# Patient Record
Sex: Male | Born: 2000 | Race: Black or African American | Hispanic: No | Marital: Single
Health system: Southern US, Community
[De-identification: ages and names within clinical notes are randomized; demographics above are authoritative.]

---
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
## 2019-02-11 ENCOUNTER — Encounter (HOSPITAL_COMMUNITY): Payer: Self-pay | Admitting: Emergency Medicine

## 2019-02-11 ENCOUNTER — Emergency Department (HOSPITAL_COMMUNITY): Payer: BC Managed Care – PPO

## 2019-02-11 ENCOUNTER — Other Ambulatory Visit: Payer: Self-pay

## 2019-02-11 ENCOUNTER — Emergency Department (HOSPITAL_COMMUNITY)
Admission: EM | Admit: 2019-02-11 | Discharge: 2019-02-11 | Payer: BC Managed Care – PPO | Attending: Emergency Medicine | Admitting: Emergency Medicine

## 2019-02-11 DIAGNOSIS — S52502A Unspecified fracture of the lower end of left radius, initial encounter for closed fracture: Secondary | ICD-10-CM

## 2019-02-11 DIAGNOSIS — Y9389 Activity, other specified: Secondary | ICD-10-CM | POA: Insufficient documentation

## 2019-02-11 DIAGNOSIS — Z23 Encounter for immunization: Secondary | ICD-10-CM | POA: Insufficient documentation

## 2019-02-11 DIAGNOSIS — Y9289 Other specified places as the place of occurrence of the external cause: Secondary | ICD-10-CM | POA: Insufficient documentation

## 2019-02-11 DIAGNOSIS — S022XXA Fracture of nasal bones, initial encounter for closed fracture: Secondary | ICD-10-CM | POA: Insufficient documentation

## 2019-02-11 DIAGNOSIS — Y999 Unspecified external cause status: Secondary | ICD-10-CM | POA: Diagnosis not present

## 2019-02-11 DIAGNOSIS — S52515A Nondisplaced fracture of left radial styloid process, initial encounter for closed fracture: Secondary | ICD-10-CM | POA: Insufficient documentation

## 2019-02-11 DIAGNOSIS — S82034B Nondisplaced transverse fracture of right patella, initial encounter for open fracture type I or II: Secondary | ICD-10-CM | POA: Insufficient documentation

## 2019-02-11 DIAGNOSIS — S032XXA Dislocation of tooth, initial encounter: Secondary | ICD-10-CM

## 2019-02-11 DIAGNOSIS — S62025A Nondisplaced fracture of middle third of navicular [scaphoid] bone of left wrist, initial encounter for closed fracture: Secondary | ICD-10-CM | POA: Insufficient documentation

## 2019-02-11 DIAGNOSIS — S52135A Nondisplaced fracture of neck of left radius, initial encounter for closed fracture: Secondary | ICD-10-CM | POA: Diagnosis not present

## 2019-02-11 DIAGNOSIS — W130XXA Fall from, out of or through balcony, initial encounter: Secondary | ICD-10-CM | POA: Insufficient documentation

## 2019-02-11 DIAGNOSIS — T1490XA Injury, unspecified, initial encounter: Secondary | ICD-10-CM

## 2019-02-11 DIAGNOSIS — S0990XA Unspecified injury of head, initial encounter: Secondary | ICD-10-CM | POA: Diagnosis present

## 2019-02-11 DIAGNOSIS — S81011A Laceration without foreign body, right knee, initial encounter: Secondary | ICD-10-CM | POA: Diagnosis not present

## 2019-02-11 DIAGNOSIS — S82034A Nondisplaced transverse fracture of right patella, initial encounter for closed fracture: Secondary | ICD-10-CM

## 2019-02-11 DIAGNOSIS — S0181XA Laceration without foreign body of other part of head, initial encounter: Secondary | ICD-10-CM | POA: Insufficient documentation

## 2019-02-11 DIAGNOSIS — S01521A Laceration with foreign body of lip, initial encounter: Secondary | ICD-10-CM | POA: Insufficient documentation

## 2019-02-11 DIAGNOSIS — T07XXXA Unspecified multiple injuries, initial encounter: Secondary | ICD-10-CM

## 2019-02-11 DIAGNOSIS — S52125A Nondisplaced fracture of head of left radius, initial encounter for closed fracture: Secondary | ICD-10-CM

## 2019-02-11 DIAGNOSIS — S62002A Unspecified fracture of navicular [scaphoid] bone of left wrist, initial encounter for closed fracture: Secondary | ICD-10-CM

## 2019-02-11 LAB — I-STAT CHEM 8, ED
BUN: 12 mg/dL (ref 6–20)
Calcium, Ion: 1.06 mmol/L — ABNORMAL LOW (ref 1.15–1.40)
Chloride: 105 mmol/L (ref 98–111)
Creatinine, Ser: 1.1 mg/dL (ref 0.61–1.24)
Glucose, Bld: 218 mg/dL — ABNORMAL HIGH (ref 70–99)
HCT: 49 % (ref 39.0–52.0)
Hemoglobin: 16.7 g/dL (ref 13.0–17.0)
Potassium: 3.4 mmol/L — ABNORMAL LOW (ref 3.5–5.1)
Sodium: 139 mmol/L (ref 135–145)
TCO2: 19 mmol/L — ABNORMAL LOW (ref 22–32)

## 2019-02-11 LAB — CBC WITH DIFFERENTIAL/PLATELET
Abs Immature Granulocytes: 0.09 10*3/uL — ABNORMAL HIGH (ref 0.00–0.07)
Basophils Absolute: 0 10*3/uL (ref 0.0–0.1)
Basophils Relative: 0 %
Eosinophils Absolute: 0 10*3/uL (ref 0.0–0.5)
Eosinophils Relative: 0 %
HCT: 49.1 % (ref 39.0–52.0)
Hemoglobin: 16 g/dL (ref 13.0–17.0)
Immature Granulocytes: 1 %
Lymphocytes Relative: 5 %
Lymphs Abs: 0.6 10*3/uL — ABNORMAL LOW (ref 0.7–4.0)
MCH: 26.8 pg (ref 26.0–34.0)
MCHC: 32.6 g/dL (ref 30.0–36.0)
MCV: 82.2 fL (ref 80.0–100.0)
Monocytes Absolute: 0.5 10*3/uL (ref 0.1–1.0)
Monocytes Relative: 4 %
Neutro Abs: 12.3 10*3/uL — ABNORMAL HIGH (ref 1.7–7.7)
Neutrophils Relative %: 90 %
Platelets: 322 10*3/uL (ref 150–400)
RBC: 5.97 MIL/uL — ABNORMAL HIGH (ref 4.22–5.81)
RDW: 13 % (ref 11.5–15.5)
WBC: 13.5 10*3/uL — ABNORMAL HIGH (ref 4.0–10.5)
nRBC: 0 % (ref 0.0–0.2)

## 2019-02-11 LAB — URINALYSIS, ROUTINE W REFLEX MICROSCOPIC
Bilirubin Urine: NEGATIVE
Glucose, UA: NEGATIVE mg/dL
Ketones, ur: 5 mg/dL — AB
Leukocytes,Ua: NEGATIVE
Nitrite: NEGATIVE
Protein, ur: >=300 mg/dL — AB
Specific Gravity, Urine: 1.042 — ABNORMAL HIGH (ref 1.005–1.030)
pH: 6 (ref 5.0–8.0)

## 2019-02-11 LAB — COMPREHENSIVE METABOLIC PANEL
ALT: 34 U/L (ref 0–44)
AST: 62 U/L — ABNORMAL HIGH (ref 15–41)
Albumin: 4.6 g/dL (ref 3.5–5.0)
Alkaline Phosphatase: 87 U/L (ref 38–126)
Anion gap: 16 — ABNORMAL HIGH (ref 5–15)
BUN: 11 mg/dL (ref 6–20)
CO2: 17 mmol/L — ABNORMAL LOW (ref 22–32)
Calcium: 9.3 mg/dL (ref 8.9–10.3)
Chloride: 104 mmol/L (ref 98–111)
Creatinine, Ser: 1.46 mg/dL — ABNORMAL HIGH (ref 0.61–1.24)
GFR calc Af Amer: >60 mL/min (ref >60)
GFR calc non Af Amer: >60 mL/min (ref >60)
Glucose, Bld: 220 mg/dL — ABNORMAL HIGH (ref 70–99)
Potassium: 3.5 mmol/L (ref 3.5–5.1)
Sodium: 137 mmol/L (ref 135–145)
Total Bilirubin: 0.7 mg/dL (ref 0.3–1.2)
Total Protein: 7.9 g/dL (ref 6.5–8.1)

## 2019-02-11 LAB — TYPE AND SCREEN
ABO/RH(D): A POS
Antibody Screen: NEGATIVE

## 2019-02-11 LAB — ETHANOL: Alcohol, Ethyl (B): <10 mg/dL (ref <10)

## 2019-02-11 LAB — ABO/RH: ABO/RH(D): A POS

## 2019-02-11 LAB — PROTIME-INR
INR: 1.2 (ref 0.8–1.2)
Prothrombin Time: 15.3 seconds — ABNORMAL HIGH (ref 11.4–15.2)

## 2019-02-11 LAB — LACTIC ACID, PLASMA: Lactic Acid, Venous: 6.7 mmol/L (ref 0.5–1.9)

## 2019-02-11 LAB — CDS SEROLOGY

## 2019-02-11 MED ORDER — FENTANYL CITRATE (PF) 100 MCG/2ML IJ SOLN
INTRAMUSCULAR | Status: AC
  Filled 2019-02-11: qty 2

## 2019-02-11 MED ORDER — FENTANYL CITRATE (PF) 100 MCG/2ML IJ SOLN
50.0000 ug | Freq: Once | INTRAMUSCULAR | Status: AC
  Administered 2019-02-11: 50 ug via INTRAVENOUS
  Filled 2019-02-11: qty 2

## 2019-02-11 MED ORDER — TETANUS-DIPHTH-ACELL PERTUSSIS 5-2.5-18.5 LF-MCG/0.5 IM SUSP
0.5000 mL | Freq: Once | INTRAMUSCULAR | Status: AC
  Administered 2019-02-11: 08:00:00 0.5 mL via INTRAMUSCULAR
  Filled 2019-02-11: qty 0.5

## 2019-02-11 MED ORDER — CEFAZOLIN SODIUM-DEXTROSE 1-4 GM/50ML-% IV SOLN
1.0000 g | Freq: Once | INTRAVENOUS | Status: AC
  Administered 2019-02-11: 08:00:00 1 g via INTRAVENOUS
  Filled 2019-02-11: qty 50

## 2019-02-11 MED ORDER — IOHEXOL 300 MG/ML  SOLN
100.0000 mL | Freq: Once | INTRAMUSCULAR | Status: AC | PRN
  Administered 2019-02-11: 100 mL via INTRAVENOUS

## 2019-02-11 MED ORDER — LIDOCAINE-EPINEPHRINE 1 %-1:100000 IJ SOLN
10.0000 mL | Freq: Once | INTRAMUSCULAR | Status: DC
  Filled 2019-02-11: qty 10

## 2019-02-11 MED ORDER — LIDOCAINE-EPINEPHRINE (PF) 2 %-1:200000 IJ SOLN
10.0000 mL | Freq: Once | INTRAMUSCULAR | Status: AC
  Administered 2019-02-11: 10 mL via INTRADERMAL

## 2019-02-11 MED ORDER — CHLORHEXIDINE GLUCONATE 0.12 % MT SOLN: 10.0000 mL | Freq: Three times a day (TID) | OROMUCOSAL | 0 refills | Status: AC

## 2019-02-11 MED ORDER — LIDOCAINE-EPINEPHRINE (PF) 2 %-1:200000 IJ SOLN
10.0000 mL | Freq: Once | INTRAMUSCULAR | Status: DC
  Filled 2019-02-11: qty 20

## 2019-02-11 MED ORDER — CLINDAMYCIN HCL 300 MG PO CAPS: 300.0000 mg | ORAL_CAPSULE | Freq: Three times a day (TID) | ORAL | 0 refills | Status: AC

## 2019-02-11 MED ORDER — HYDROCODONE-ACETAMINOPHEN 5-325 MG PO TABS: 1.0000 | ORAL_TABLET | Freq: Four times a day (QID) | ORAL | 0 refills | Status: AC | PRN

## 2019-02-11 MED ORDER — FENTANYL CITRATE (PF) 100 MCG/2ML IJ SOLN
INTRAMUSCULAR | Status: AC | PRN
  Administered 2019-02-11: 50 ug via INTRAVENOUS

## 2019-02-11 MED ORDER — MORPHINE SULFATE (PF) 4 MG/ML IV SOLN
4.0000 mg | Freq: Once | INTRAVENOUS | Status: AC
  Administered 2019-02-11: 09:00:00 4 mg via INTRAVENOUS
  Filled 2019-02-11: qty 1

## 2019-02-11 MED ORDER — LIDOCAINE-EPINEPHRINE (PF) 2 %-1:200000 IJ SOLN
INTRAMUSCULAR | Status: AC
  Filled 2019-02-11: qty 20

## 2019-02-11 MED ORDER — SODIUM CHLORIDE 0.9 % IV BOLUS
2000.0000 mL | Freq: Once | INTRAVENOUS | Status: AC
  Administered 2019-02-11: 08:00:00 2000 mL via INTRAVENOUS

## 2019-02-11 NOTE — ED Provider Notes (Signed)
[facility] Note   CSN: [id number] Arrival date & time: 02/11/19  [id number]     History   Chief Complaint Chief Complaint  Patient presents with  . Fall  . Trauma    HPI Aaron Holt is a [age] y.o. male.     Patient is a black male approximately [age] years of age brought by EMS after a fall.  From what I am told, the patient is intoxicated on LSD and alcohol.  He was reported to have fallen nearly 40 feet from a balcony.  He is complaining of lacerations to his chin, lower lip, and right knee.  Patient has been apparently combative and uncooperative with EMS and law enforcement.  History is limited secondary to this.  The history is provided by the patient.    No past medical history on file.  There are no active problems to display for this patient.         Home Medications    Prior to Admission medications   Not on File    Family History No family history on file.  Social History Social History   Tobacco Use  . Smoking status: Not on file  Substance Use Topics  . Alcohol use: Not on file  . Drug use: Not on file     Allergies   Patient has no allergy information on record.   Review of Systems Review of Systems  Unable to perform ROS: Acuity of condition     Physical Exam Updated Vital Signs BP 130/72   Pulse 100   Temp (!) 97.5 F (36.4 C) (Tympanic)   Resp 16   SpO2 97%   Physical Exam Vitals signs and nursing note reviewed.  Constitutional:      General: He is not in acute distress.    Appearance: He is well-developed. He is not diaphoretic.  HENT:     Head: Normocephalic.     Comments: There are lacerations to the chin.  Tissue is somewhat macerated.  There is a laceration to the lower lip.  It is split open in the middle.    Mouth/Throat:     Comments: There is missing dentition. Neck:     Musculoskeletal: Normal range of motion and neck supple.  Cardiovascular:     Rate and  Rhythm: Normal rate and regular rhythm.     Heart sounds: No murmur. No friction rub.  Pulmonary:     Effort: Pulmonary effort is normal. No respiratory distress.     Breath sounds: Normal breath sounds. No wheezing or rales.  Abdominal:     General: Bowel sounds are normal. There is no distension.     Palpations: Abdomen is soft.     Tenderness: There is no abdominal tenderness.  Musculoskeletal: Normal range of motion.     Comments: There is a 5 cm laceration to the medial aspect of the right knee.  Skin:    General: Skin is warm and dry.  Neurological:     Mental Status: He is alert and oriented to person, place, and time.     Coordination: Coordination normal.      ED Treatments / Results  Labs (all labs ordered are listed, but only abnormal results are displayed) Labs Reviewed  COMPREHENSIVE METABOLIC PANEL  ETHANOL  CBC WITH DIFFERENTIAL/PLATELET  LACTIC ACID, PLASMA  LACTIC ACID, PLASMA  TYPE AND SCREEN    EKG ED ECG REPORT   Date: 02/12/2019  Rate: 104  Rhythm:  sinus tachycardia  QRS Axis: normal  Intervals: normal  ST/T Wave abnormalities: normal  Conduction Disutrbances:none  Narrative Interpretation:   Old EKG Reviewed: none available  I have personally reviewed the EKG tracing and agree with the computerized printout as noted.   Radiology No results found.  Procedures Procedures (including critical care time)  Medications Ordered in ED Medications - No data to display   Initial Impression / Assessment and Plan / ED Course  I have reviewed the triage vital signs and the nursing notes.  Pertinent labs & imaging results that were available during my care of the patient were reviewed by me and considered in my medical decision making (see chart for details).  Patient brought by EMS for evaluation of a fall.  From what I am told, this patient was intoxicated on both alcohol and hallucinogenic's when he reportedly fell 40 feet from a balcony.   Patient with injuries to his face and right leg.  History is somewhat difficult as the patient is intoxicated and combative with EMS.  On initial evaluation, patient hemodynamically stable and protecting his airway.  Oxygen saturations are adequate on nonrebreather.  His heart is tachycardic, but regular.  Breath sounds are clear and equal.  Abdomen is soft and nontender.  He does have a complex laceration to the lower lip, lacerations to the chin, and several missing teeth.  He also has a laceration to the right lower leg just below his right knee.  Patient will undergo trauma radiographs to further evaluate his injuries.  Care will be signed out to Dr. Wilson Singer at shift change who will obtain the results of the studies and determine the final disposition.  CRITICAL CARE Performed by: Veryl Speak Total critical care time: 35 minutes Critical care time was exclusive of separately billable procedures and treating other patients. Critical care was necessary to treat or prevent imminent or life-threatening deterioration. Critical care was time spent personally by me on the following activities: development of treatment plan with patient and/or surrogate as well as nursing, discussions with consultants, evaluation of patient's response to treatment, examination of patient, obtaining history from patient or surrogate, ordering and performing treatments and interventions, ordering and review of laboratory studies, ordering and review of radiographic studies, pulse oximetry and re-evaluation of patient's condition.   Final Clinical Impressions(s) / ED Diagnoses   Final diagnoses:  Trauma    ED Discharge Orders    None       Veryl Speak, MD 02/12/19 [phone]

## 2019-02-11 NOTE — ED Notes (Signed)
[phone] Please call Mom.
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
## 2019-02-11 NOTE — Progress Notes (Signed)
Orthopedic Tech Progress Note Patient Details:  Aaron Holt Jun 25, 2000 [id number] Patient did not use or touch crutches. Patient ID: Aaron Holt, male   DOB: [birth year], 18 y.o.   MRN: [id number]   [name] 02/11/2019, 1:27 PM

## 2019-02-11 NOTE — ED Notes (Signed)
Patient verbalizes understanding of discharge instructions. Opportunity for questioning and answers were provided. Armband removed by staff, pt discharged from ED.  
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
## 2019-02-11 NOTE — ED Provider Notes (Addendum)
Assumed care in sign out. On secondary survey, pt noted to have significant L elbow/wrist/hand pain. Imaging as below. Ortho consulted on knee. Don't feel that open injury. I will wash out and close in the ED. Knee immobilizer. Mouth rinses, soft diet and dental FU for oral injuries. Numerous complex lip/facial lacerations closed. L radial head, distal radius and carpal bone bone fxs. Closed. NVI. NWB. Hand/ortho follow-up. Splint/sling. Tiny laceration near R elbow. No fx but likely FB noted on imaging. I cannot directly visualize. Irrigated but no obvious FB removed. Wound left open because of possibility of retained FB. Needs to keep a close eye on it.  Wound is small and should heal fine otherwise. Pt now awake/alert. Following commands and responding appropriately to questioning. HD stable. Pt being discharged in police custody for assaulting EMS.    LACERATION REPAIR Performed by: Raeford RazorStephen Sloane Junkin Authorized by: Raeford RazorStephen Persais Ethridge Consent: Verbal consent obtained. Risks and benefits: risks, benefits and alternatives were discussed Consent given by: patient Patient identity confirmed: provided demographic data Prepped and Draped in normal sterile fashion Wound explored  Laceration Location: lower lip, 5 cm in total length. Full thickness down into musculature and involving vermilion border.   Laceration Length: 5 cm  Tooth fragments noted in wound. Removed. Wound irrigated and explored. No additional fragments or other FB noted. Part of laceration involving oral mucosa just very loosely closed.   Anesthesia: local infiltration  Local anesthetic: lidocaine 2% w epinephrine  Anesthetic total: 4 ml  Irrigation method: syringe Amount of cleaning: standard  Closure: deep tissues approximated with 5-0 vicryl. Skin closed with 5-0 plain gut in simple interrupted fashion   Patient tolerance: Patient tolerated the procedure well with no immediate complications.   LACERATION REPAIR Performed  by: Raeford RazorStephen Lenoria Narine Authorized by: Raeford RazorStephen Zian Delair Consent: Verbal consent obtained. Risks and benefits: risks, benefits and alternatives were discussed Consent given by: patient Patient identity confirmed: provided demographic data Prepped and Draped in normal sterile fashion Wound explored  Laceration Location: lip  Laceration Length: 0.5 cm  No Foreign Bodies seen or palpated  Anesthesia: local infiltration  Local anesthetic: lidocaine 2% w/ epinephrine  Anesthetic total: 0.5 ml  Irrigation method: syringe Amount of cleaning: standard  Skin closure: single simple intrerupted suture with 5-0 plain gut   Patient tolerance: Patient tolerated the procedure well with no immediate complications.  LACERATION REPAIR Performed by: Raeford RazorStephen Kalman Nylen Authorized by: Raeford RazorStephen Sami Roes Consent: Verbal consent obtained. Risks and benefits: risks, benefits and alternatives were discussed Consent given by: patient Patient identity confirmed: provided demographic data Prepped and Draped in normal sterile fashion Wound explored  Laceration Location: chin, macerated inverted "U" shaped laceration.  Laceration Length: 3 cm  No Foreign Bodies seen or palpated  Anesthesia: local infiltration  Local anesthetic: lidocaine 2 % w epinephrine  Anesthetic total: 1.5 ml  Irrigation method: syringe Amount of cleaning: standard  Skin closure: 5-0 plain gut  Technique: simple interrupted  Patient tolerance: Patient tolerated the procedure well with no immediate complications.  LACERATION REPAIR Performed by: Raeford RazorStephen Rion Catala Authorized by: Raeford RazorStephen Kahlia Lagunes Consent: Verbal consent obtained. Risks and benefits: risks, benefits and alternatives were discussed Consent given by: patient Patient identity confirmed: provided demographic data Prepped and Draped in normal sterile fashion Wound explored  Laceration Location: R knee, just inferior/medial to patella. 6 cm in total length.   Laceration  Length: 6 cm  No Foreign Bodies seen or palpated  Anesthesia: local infiltration  Local anesthetic: lidocaine 2% w epinephrine  Anesthetic total: 4  ml  Irrigation method: syringe Amount of cleaning: standard  Skin closure: 3-0 vicryl plus to approximate subcutaneous tissue. 3-0 mono to close skin with horizontal mattress.   Patient tolerance: Patient tolerated the procedure well with no immediate complications.   Dg Elbow Complete Left  Result Date: 02/11/2019 CLINICAL DATA:  Fall EXAM: LEFT ELBOW - COMPLETE 3+ VIEW COMPARISON:  None. FINDINGS: Nondisplaced fracture radial neck. Joint effusion. Normal elbow joint. IMPRESSION: Nondisplaced fracture radial neck with joint effusion. Electronically Signed   By: Marlan Palau M.D.   On: 02/11/2019 09:59   Dg Wrist Complete Left  Result Date: 02/11/2019 CLINICAL DATA:  Fall EXAM: LEFT WRIST - COMPLETE 3+ VIEW COMPARISON:  Left hand 02/11/2019 FINDINGS: Fracture through the waist of the navicular bone better seen on the hand study Nondisplaced fracture of the radial styloid. Mildly displaced fracture dorsal distal radius. No significant degenerative change or arthropathy IMPRESSION: Nondisplaced fracture waist of navicular bone Fracture of the radial styloid and dorsal distal radius. Electronically Signed   By: Marlan Palau M.D.   On: 02/11/2019 10:01   Ct Head Wo Contrast  Result Date: 02/11/2019 CLINICAL DATA:  Pain following fall EXAM: CT HEAD WITHOUT CONTRAST CT MAXILLOFACIAL WITHOUT CONTRAST CT CERVICAL SPINE WITHOUT CONTRAST TECHNIQUE: Multidetector CT imaging of the head, cervical spine, and maxillofacial structures were performed using the standard protocol without intravenous contrast. Multiplanar CT image reconstructions of the cervical spine and maxillofacial structures were also generated. COMPARISON:  None. FINDINGS: CT HEAD FINDINGS Brain: The ventricles are normal in size and configuration. There is no intracranial mass,  hemorrhage, extra-axial fluid collection, or midline shift. Brain parenchyma appears5 unremarkable. There is no evident acute infarct. Vascular: There is no hyperdense vessel. There is no evident vascular calcification. Skull: The bony calvarium appears intact. Other: Mastoid air cells are clear. CT MAXILLOFACIAL FINDINGS Osseous: There are avulsion is arising from the anterior aspect of the superior alveolar ridge near the vomer. There is a fracture of the right anterior nasal bone in near anatomic alignment. No other fractures are evident. No dislocation. No blastic or lytic bone lesions. Orbits: Orbits appear symmetric bilaterally. There is no intraorbital lesion. Sinuses: There is slight mucosal thickening in the lateral left maxillary antrum. There is mucosal thickening in several ethmoid air cells. Other paranasal sinuses are clear. No air-fluid level. No bony destruction or expansion. Ostiomeatal unit complexes appear symmetric bilaterally. There is mild edema of the left inferior nasal turbinate with narrowing of the nares on the left. There is mild leftward deviation of the nasal septum. Soft tissues: There is soft tissue swelling with air anterior to the mandible in the midline. There is developing hematoma in this area. There is also soft tissue edema over the rightward aspect of the nose reason. There is soft tissue swelling to a lesser extent over the right mid to lower face. No abscess evident. Salivary glands appear symmetric bilaterally. Tongue and tongue base regions appear normal. No adenopathy. Visualized pharynx appears normal. CT CERVICAL SPINE FINDINGS Alignment: There is no evidence spondylolisthesis. Skull base and vertebrae: Skull base and craniocervical junction regions appear normal. No fracture evident. No blastic or lytic bone lesions. Soft tissues and spinal canal: Prevertebral soft tissues and predental space regions are normal. No evident cord or canal hematoma. No paraspinous  lesions. Disc levels: Disk spaces appear unremarkable. No nerve root edema or effacement. No disc extrusion or stenosis. Upper chest: Visualized upper lung regions are clear. Other: None IMPRESSION: CT head: Study within normal  limits. CT maxillofacial: 1. Avulsions arising from the anterior superior alveolar ridge, slightly to the left and right of the vomer. Nondisplaced fracture anterior right nasal bone. No other fractures are evident. 2. No intraorbital lesions. 3. Soft tissue swelling over the right face and right nose region. Developing hematoma with foci of air anterior to the mandible in the midline. Note that the mandible appears intact. 4. Mild leftward deviation of the nasal septum with mild nares obstruction on the left. Note that the ostiomeatal unit complexes bilaterally are patent. CT cervical spine: No fracture or spondylolisthesis. No appreciable arthropathy. No nerve root edema or effacement. No disc extrusion or stenosis. Electronically Signed   By: Lowella Grip III M.D.   On: 02/11/2019 08:06   Ct Chest W Contrast  Result Date: 02/11/2019 CLINICAL DATA:  Patient status post fall from a balcony today. Initial encounter. EXAM: CT CHEST, ABDOMEN, AND PELVIS WITH CONTRAST TECHNIQUE: Multidetector CT imaging of the chest, abdomen and pelvis was performed following the standard protocol during bolus administration of intravenous contrast. CONTRAST:  100 mL OMNIPAQUE IOHEXOL 300 MG/ML  SOLN COMPARISON:  None. Plain films of the pelvis and chest this same day. FINDINGS: CT CHEST FINDINGS Cardiovascular: No significant vascular findings. Normal heart size. No pericardial effusion. Bovine aortic arch incidentally noted. Mediastinum/Nodes: No enlarged mediastinal, hilar, or axillary lymph nodes. Thyroid gland, trachea, and esophagus demonstrate no significant findings. Lungs/Pleura: Lungs are clear. No pleural effusion or pneumothorax. Musculoskeletal: Negative.  No fracture or focal lesion. CT  ABDOMEN PELVIS FINDINGS Hepatobiliary: No focal liver abnormality is seen. No gallstones, gallbladder wall thickening, or biliary dilatation. Pancreas: Unremarkable. No pancreatic ductal dilatation or surrounding inflammatory changes. Spleen: Normal in size without focal abnormality. Adrenals/Urinary Tract: Adrenal glands are unremarkable. Kidneys are normal, without renal calculi, focal lesion, or hydronephrosis. Bladder is unremarkable. Stomach/Bowel: Stomach is within normal limits. Appendix appears normal. No evidence of bowel wall thickening, distention, or inflammatory changes. Vascular/Lymphatic: No significant vascular findings are present. No enlarged abdominal or pelvic lymph nodes. Reproductive: Prostate is unremarkable. Other: None. Musculoskeletal: Negative. IMPRESSION: Normal CT chest, abdomen and pelvis. Electronically Signed   By: Inge Rise M.D.   On: 02/11/2019 08:00   Ct Cervical Spine Wo Contrast  Result Date: 02/11/2019 CLINICAL DATA:  Pain following fall EXAM: CT HEAD WITHOUT CONTRAST CT MAXILLOFACIAL WITHOUT CONTRAST CT CERVICAL SPINE WITHOUT CONTRAST TECHNIQUE: Multidetector CT imaging of the head, cervical spine, and maxillofacial structures were performed using the standard protocol without intravenous contrast. Multiplanar CT image reconstructions of the cervical spine and maxillofacial structures were also generated. COMPARISON:  None. FINDINGS: CT HEAD FINDINGS Brain: The ventricles are normal in size and configuration. There is no intracranial mass, hemorrhage, extra-axial fluid collection, or midline shift. Brain parenchyma appears5 unremarkable. There is no evident acute infarct. Vascular: There is no hyperdense vessel. There is no evident vascular calcification. Skull: The bony calvarium appears intact. Other: Mastoid air cells are clear. CT MAXILLOFACIAL FINDINGS Osseous: There are avulsion is arising from the anterior aspect of the superior alveolar ridge near the vomer.  There is a fracture of the right anterior nasal bone in near anatomic alignment. No other fractures are evident. No dislocation. No blastic or lytic bone lesions. Orbits: Orbits appear symmetric bilaterally. There is no intraorbital lesion. Sinuses: There is slight mucosal thickening in the lateral left maxillary antrum. There is mucosal thickening in several ethmoid air cells. Other paranasal sinuses are clear. No air-fluid level. No bony destruction or expansion. Ostiomeatal unit complexes  appear symmetric bilaterally. There is mild edema of the left inferior nasal turbinate with narrowing of the nares on the left. There is mild leftward deviation of the nasal septum. Soft tissues: There is soft tissue swelling with air anterior to the mandible in the midline. There is developing hematoma in this area. There is also soft tissue edema over the rightward aspect of the nose reason. There is soft tissue swelling to a lesser extent over the right mid to lower face. No abscess evident. Salivary glands appear symmetric bilaterally. Tongue and tongue base regions appear normal. No adenopathy. Visualized pharynx appears normal. CT CERVICAL SPINE FINDINGS Alignment: There is no evidence spondylolisthesis. Skull base and vertebrae: Skull base and craniocervical junction regions appear normal. No fracture evident. No blastic or lytic bone lesions. Soft tissues and spinal canal: Prevertebral soft tissues and predental space regions are normal. No evident cord or canal hematoma. No paraspinous lesions. Disc levels: Disk spaces appear unremarkable. No nerve root edema or effacement. No disc extrusion or stenosis. Upper chest: Visualized upper lung regions are clear. Other: None IMPRESSION: CT head: Study within normal limits. CT maxillofacial: 1. Avulsions arising from the anterior superior alveolar ridge, slightly to the left and right of the vomer. Nondisplaced fracture anterior right nasal bone. No other fractures are  evident. 2. No intraorbital lesions. 3. Soft tissue swelling over the right face and right nose region. Developing hematoma with foci of air anterior to the mandible in the midline. Note that the mandible appears intact. 4. Mild leftward deviation of the nasal septum with mild nares obstruction on the left. Note that the ostiomeatal unit complexes bilaterally are patent. CT cervical spine: No fracture or spondylolisthesis. No appreciable arthropathy. No nerve root edema or effacement. No disc extrusion or stenosis. Electronically Signed   By: Bretta Bang III M.D.   On: 02/11/2019 08:06   Ct Abdomen Pelvis W Contrast  Result Date: 02/11/2019 CLINICAL DATA:  Patient status post fall from a balcony today. Initial encounter. EXAM: CT CHEST, ABDOMEN, AND PELVIS WITH CONTRAST TECHNIQUE: Multidetector CT imaging of the chest, abdomen and pelvis was performed following the standard protocol during bolus administration of intravenous contrast. CONTRAST:  100 mL OMNIPAQUE IOHEXOL 300 MG/ML  SOLN COMPARISON:  None. Plain films of the pelvis and chest this same day. FINDINGS: CT CHEST FINDINGS Cardiovascular: No significant vascular findings. Normal heart size. No pericardial effusion. Bovine aortic arch incidentally noted. Mediastinum/Nodes: No enlarged mediastinal, hilar, or axillary lymph nodes. Thyroid gland, trachea, and esophagus demonstrate no significant findings. Lungs/Pleura: Lungs are clear. No pleural effusion or pneumothorax. Musculoskeletal: Negative.  No fracture or focal lesion. CT ABDOMEN PELVIS FINDINGS Hepatobiliary: No focal liver abnormality is seen. No gallstones, gallbladder wall thickening, or biliary dilatation. Pancreas: Unremarkable. No pancreatic ductal dilatation or surrounding inflammatory changes. Spleen: Normal in size without focal abnormality. Adrenals/Urinary Tract: Adrenal glands are unremarkable. Kidneys are normal, without renal calculi, focal lesion, or hydronephrosis. Bladder is  unremarkable. Stomach/Bowel: Stomach is within normal limits. Appendix appears normal. No evidence of bowel wall thickening, distention, or inflammatory changes. Vascular/Lymphatic: No significant vascular findings are present. No enlarged abdominal or pelvic lymph nodes. Reproductive: Prostate is unremarkable. Other: None. Musculoskeletal: Negative. IMPRESSION: Normal CT chest, abdomen and pelvis. Electronically Signed   By: Drusilla Kanner M.D.   On: 02/11/2019 08:00   Dg Pelvis Portable  Result Date: 02/11/2019 CLINICAL DATA:  Fall from balcony. EXAM: PORTABLE PELVIS 1-2 VIEWS COMPARISON:  None. FINDINGS: There is no evidence of pelvic fracture  or diastasis. No pelvic bone lesions are seen. IMPRESSION: Negative. Electronically Signed   By: Marnee SpringJonathon  Watts M.D.   On: 02/11/2019 06:45   Dg Chest Port 1 View  Result Date: 02/11/2019 CLINICAL DATA:  Level 2 trauma. EXAM: PORTABLE CHEST 1 VIEW COMPARISON:  None. FINDINGS: The heart size and mediastinal contours are within normal limits. Both lungs are clear. The visualized skeletal structures are unremarkable. IMPRESSION: No active disease. Electronically Signed   By: Marnee SpringJonathon  Watts M.D.   On: 02/11/2019 06:43   Dg Knee Right Port  Result Date: 02/11/2019 CLINICAL DATA:  Level 2 trauma EXAM: PORTABLE RIGHT KNEE - 1-2 VIEW COMPARISON:  None. FINDINGS: Transverse fracture through the lower patella on the lateral view with regional soft tissue swelling, nondisplaced. No dislocation. Small joint effusion. Linear density overlapping the proximal tibia on the frontal and oblique views is from overlapping bandage. IMPRESSION: Nondisplaced lower patella fracture. Electronically Signed   By: Marnee SpringJonathon  Watts M.D.   On: 02/11/2019 06:44   Dg Hand Complete Left  Result Date: 02/11/2019 CLINICAL DATA:  Fall EXAM: LEFT HAND - COMPLETE 3+ VIEW COMPARISON:  None. FINDINGS: Nondisplaced fracture of the waist of the navicular bone Fracture of the distal radius and  radial styloid. No other hand fracture identified.  No arthropathy. IMPRESSION: Nondisplaced fracture of the waist of the navicular Fracture distal radius. Electronically Signed   By: Marlan Palauharles  Clark M.D.   On: 02/11/2019 10:02   Ct Maxillofacial Wo Contrast  Result Date: 02/11/2019 CLINICAL DATA:  Pain following fall EXAM: CT HEAD WITHOUT CONTRAST CT MAXILLOFACIAL WITHOUT CONTRAST CT CERVICAL SPINE WITHOUT CONTRAST TECHNIQUE: Multidetector CT imaging of the head, cervical spine, and maxillofacial structures were performed using the standard protocol without intravenous contrast. Multiplanar CT image reconstructions of the cervical spine and maxillofacial structures were also generated. COMPARISON:  None. FINDINGS: CT HEAD FINDINGS Brain: The ventricles are normal in size and configuration. There is no intracranial mass, hemorrhage, extra-axial fluid collection, or midline shift. Brain parenchyma appears5 unremarkable. There is no evident acute infarct. Vascular: There is no hyperdense vessel. There is no evident vascular calcification. Skull: The bony calvarium appears intact. Other: Mastoid air cells are clear. CT MAXILLOFACIAL FINDINGS Osseous: There are avulsion is arising from the anterior aspect of the superior alveolar ridge near the vomer. There is a fracture of the right anterior nasal bone in near anatomic alignment. No other fractures are evident. No dislocation. No blastic or lytic bone lesions. Orbits: Orbits appear symmetric bilaterally. There is no intraorbital lesion. Sinuses: There is slight mucosal thickening in the lateral left maxillary antrum. There is mucosal thickening in several ethmoid air cells. Other paranasal sinuses are clear. No air-fluid level. No bony destruction or expansion. Ostiomeatal unit complexes appear symmetric bilaterally. There is mild edema of the left inferior nasal turbinate with narrowing of the nares on the left. There is mild leftward deviation of the nasal septum.  Soft tissues: There is soft tissue swelling with air anterior to the mandible in the midline. There is developing hematoma in this area. There is also soft tissue edema over the rightward aspect of the nose reason. There is soft tissue swelling to a lesser extent over the right mid to lower face. No abscess evident. Salivary glands appear symmetric bilaterally. Tongue and tongue base regions appear normal. No adenopathy. Visualized pharynx appears normal. CT CERVICAL SPINE FINDINGS Alignment: There is no evidence spondylolisthesis. Skull base and vertebrae: Skull base and craniocervical junction regions appear normal. No fracture evident.  No blastic or lytic bone lesions. Soft tissues and spinal canal: Prevertebral soft tissues and predental space regions are normal. No evident cord or canal hematoma. No paraspinous lesions. Disc levels: Disk spaces appear unremarkable. No nerve root edema or effacement. No disc extrusion or stenosis. Upper chest: Visualized upper lung regions are clear. Other: None IMPRESSION: CT head: Study within normal limits. CT maxillofacial: 1. Avulsions arising from the anterior superior alveolar ridge, slightly to the left and right of the vomer. Nondisplaced fracture anterior right nasal bone. No other fractures are evident. 2. No intraorbital lesions. 3. Soft tissue swelling over the right face and right nose region. Developing hematoma with foci of air anterior to the mandible in the midline. Note that the mandible appears intact. 4. Mild leftward deviation of the nasal septum with mild nares obstruction on the left. Note that the ostiomeatal unit complexes bilaterally are patent. CT cervical spine: No fracture or spondylolisthesis. No appreciable arthropathy. No nerve root edema or effacement. No disc extrusion or stenosis. Electronically Signed   By: Bretta Bang III M.D.   On: 02/11/2019 08:06      Raeford Razor, MD 02/11/19 1207

## 2019-02-11 NOTE — Progress Notes (Signed)
Orthopedic Tech Progress Note Patient Details:  Aaron Holt 04-16-2001 [id number]  Ortho Devices Type of Ortho Device: Sugartong splint, Arm sling, Knee Immobilizer, Crutches Ortho Device/Splint Location: ULE, LRE Ortho Device/Splint Interventions: Adjustment, Application, Ordered   Post Interventions Patient Tolerated: Fair Instructions Provided: Care of device, Adjustment of device   [name] 02/11/2019, 11:22 AM

## 2019-02-11 NOTE — ED Notes (Signed)
Patient transported to X-ray 

## 2019-02-11 NOTE — Discharge Instructions (Signed)
You have multiple fractures in your L arm (L elbow, wrist, hand). You also have a fracture of your R patella (knee cap). You need to follow-up with orthopedic surgery within the next 3-5 days. Wear knee immobilizer and use crutches with R arm.  Use sling for comfort. Take prescribed pain medication and also 600 mg of ibuprofen every 6 hours as needed for pain. Clean your wounds with mild soap and warm water daily. Do not soak or scrub. The stitches in your face will dissolve. The ones in your knee need to come out in 14 day. Soft diet. Avoid salty, spicy and very hot/cold foods. Use antibiotic rinse and oral antibiotics until finished. You need to follow-up with dentist/oral surgery within the next week.

## 2019-02-11 NOTE — Progress Notes (Signed)
Orthopedic Tech Progress Note Patient Details:  Aaron Holt 2001-02-06 [id number] Had to remove the 1st splint and reapply it with a thumb spica splint included  Ortho Devices Type of Ortho Device: Sugartong splint, Thumb spica splint Splint Material: Plaster Ortho Device/Splint Location: ULE Ortho Device/Splint Interventions: Adjustment, Application, Ordered   Post Interventions Patient Tolerated: Well Instructions Provided: Care of device, Adjustment of device   [name] 02/11/2019, 1:26 PM

## 2019-02-11 NOTE — ED Notes (Signed)
RN and Tec to CT2 w/ pt.  Restraints removed at this time.  Pt cooperative.

## 2019-02-12 ENCOUNTER — Encounter (HOSPITAL_COMMUNITY): Payer: Self-pay | Admitting: Emergency Medicine

## 2019-02-13 ENCOUNTER — Other Ambulatory Visit: Payer: Self-pay | Admitting: Orthopedic Surgery

## 2019-02-13 ENCOUNTER — Ambulatory Visit
Admission: RE | Admit: 2019-02-13 | Discharge: 2019-02-13 | Disposition: A | Discharge status: Home / Self Care | Payer: BC Managed Care – PPO | Source: Ambulatory Visit | Attending: Orthopedic Surgery | Admitting: Orthopedic Surgery

## 2019-02-13 DIAGNOSIS — S62002A Unspecified fracture of navicular [scaphoid] bone of left wrist, initial encounter for closed fracture: Secondary | ICD-10-CM

## 2019-02-13 DIAGNOSIS — M25532 Pain in left wrist: Secondary | ICD-10-CM

## 2020-11-21 IMAGING — CT CT OF THE LEFT WRIST WITHOUT CONTRAST
5 of 6 series · 17 of 35 positions shown, 18 images · non-contrast
Comparison: X-ray 02/11/2019

CLINICAL DATA: Wrist pain, evaluate fractures

EXAM:
CT OF THE LEFT WRIST WITHOUT CONTRAST
TECHNIQUE: Multidetector CT imaging was performed according to the standard
protocol. Multiplanar CT image reconstructions were also generated.

[Series 4: wrist 1.50 br60 s3 axial bone hd fov · sagittal · 0.29mm/px · 6 of 149 slices shown]
[im 25/149  bone]
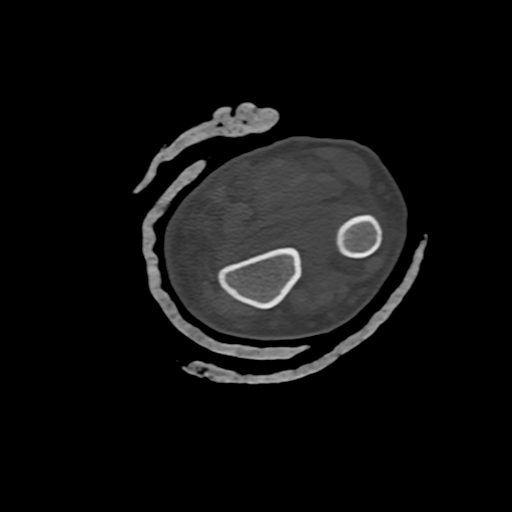
[im 50/149  bone]
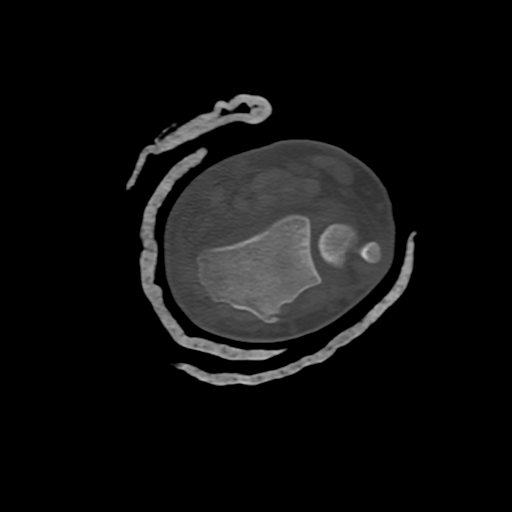
[im 60/149  soft-tissue]
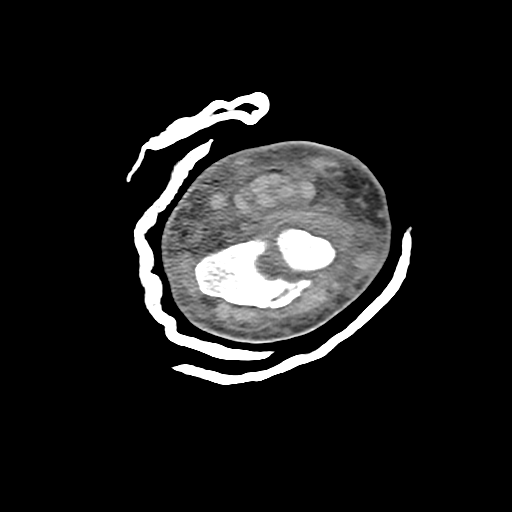
[im 75/149  bone]
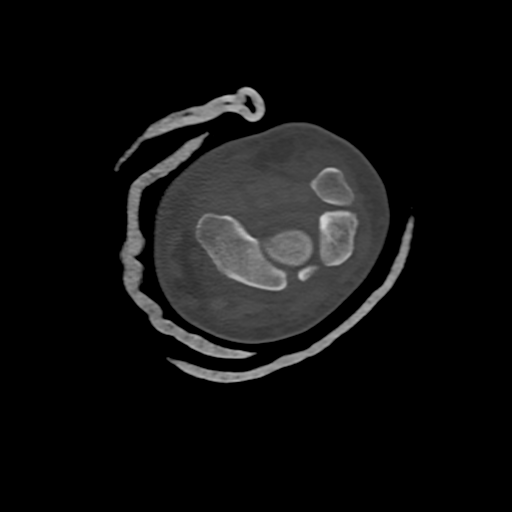
[im 99/149  bone]
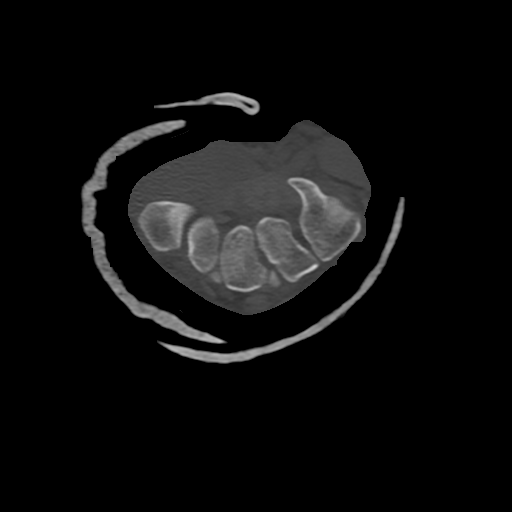
[im 124/149  bone]
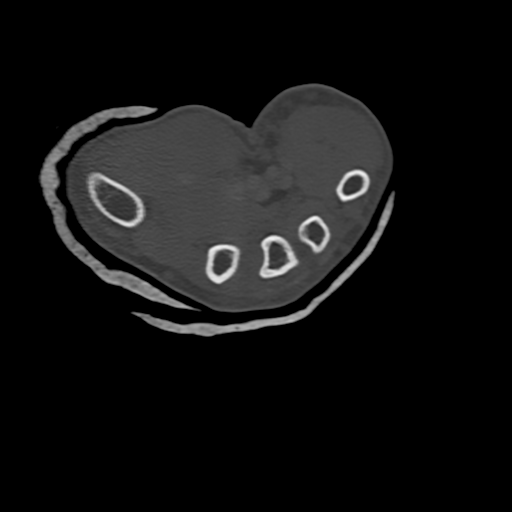

[Series 1004: cor left bone · coronal · 0.29mm/px · 3 of 36 slices shown]
[im 9/36  bone]
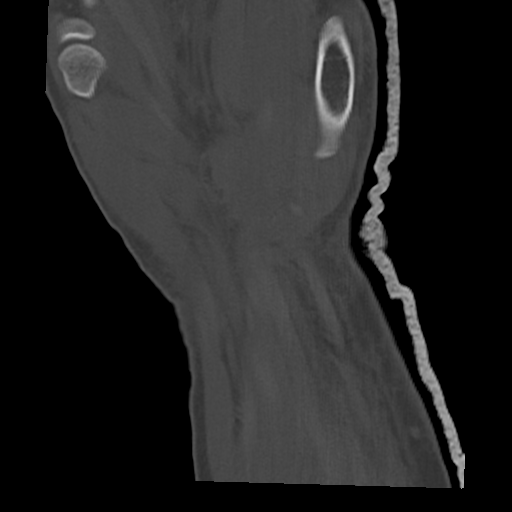
[im 18/36  bone]
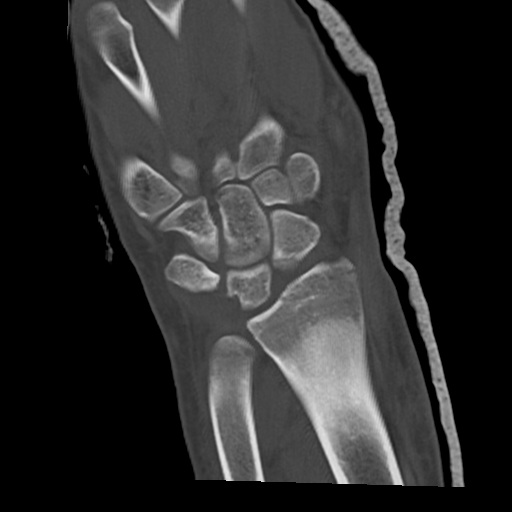
[im 27/36  bone]
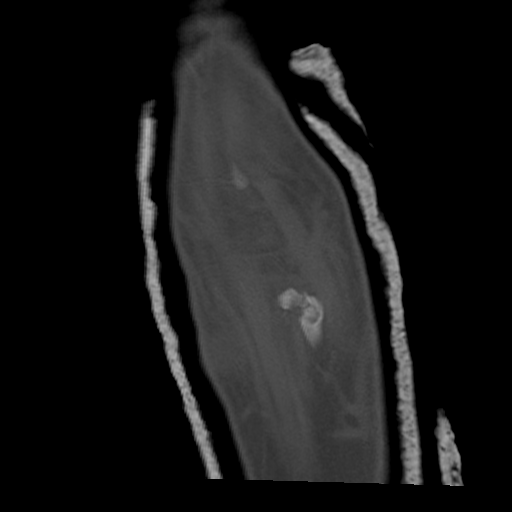

[Series 1008: sag left bone · axial · 0.29mm/px · z∈[-652,-630]mm · 2 of 36 slices shown, 3 images]
[im 12/36  soft-tissue]
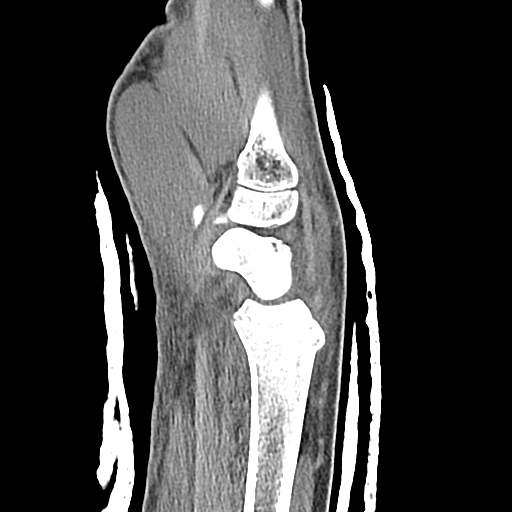
[im 12/36  bone]
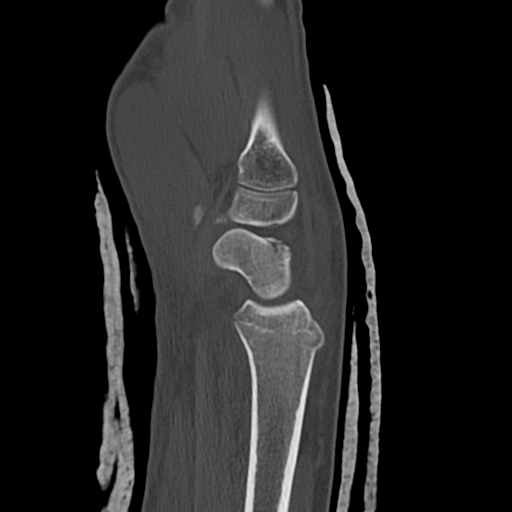
[im 24/36  bone]
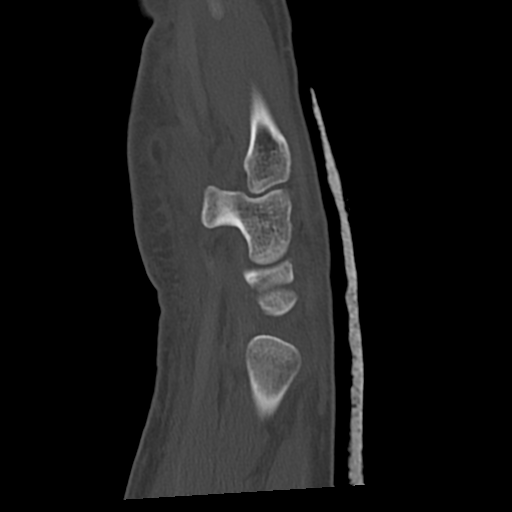

[Series 1012: cor soft left · coronal · 0.29mm/px · 3 of 37 slices shown]
[im 8/37  bone]
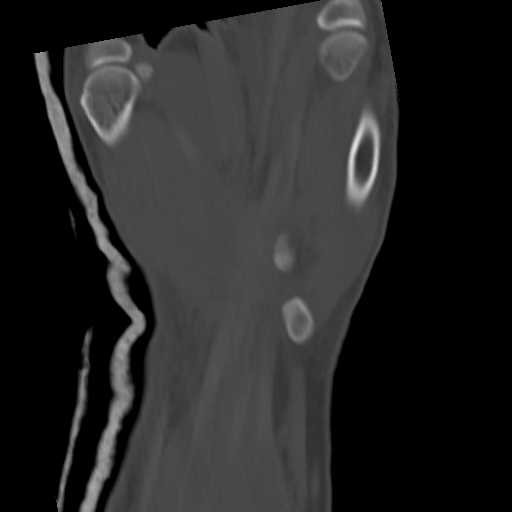
[im 15/37  bone]
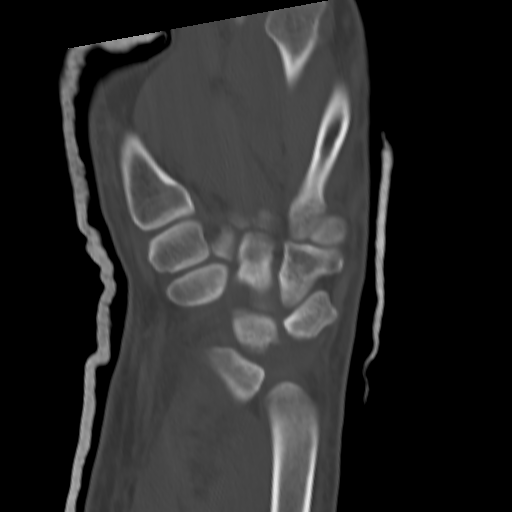
[im 22/37  bone]
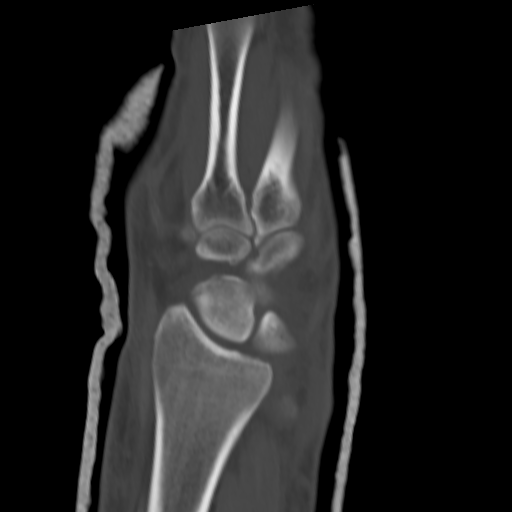

[Series 1016: sag soft · axial · 0.29mm/px · z∈[-663,-621]mm · 3 of 45 slices shown]
[im 12/45  soft-tissue]
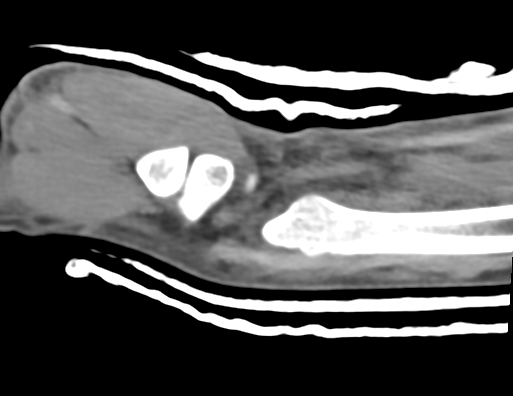
[im 23/45  soft-tissue]
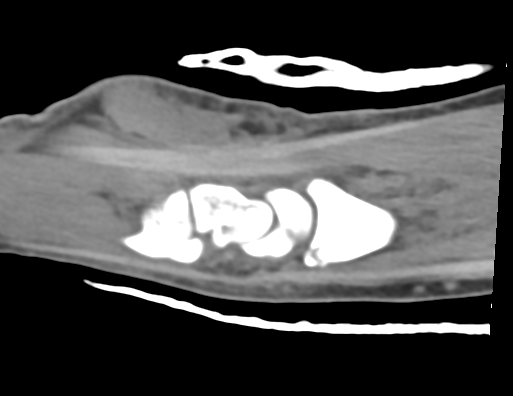
[im 34/45  soft-tissue]
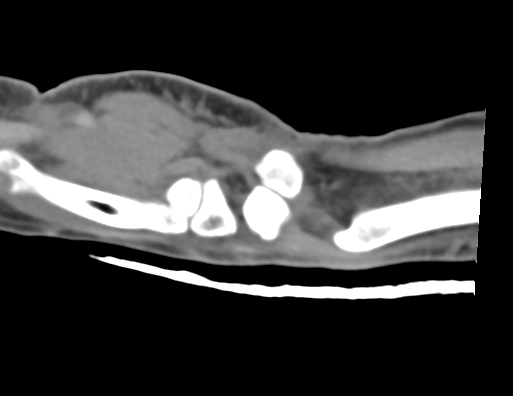

[17 of 35 positions shown; findings below may reference images not displayed]

FINDINGS: Bones/Joint/Cartilage

Acute distal radius fracture predominantly involving the dorsal
cortex with minimal 1-2 mm of dorsal displacement (series 0007,
image 19). Fracture extends intra-articularly to the radiocarpal
joint without significant articular surface diastasis. Nondisplaced
fracture components extend to the volar cortex as well as involving
the base of Lister's tubercle (series 4, image 94). Fracture does
not appear to involve the distal radioulnar joint.

Mildly comminuted nondisplaced fracture of the distal pole of the
scaphoid (series 4222, image 17). Nondisplaced scaphoid waist
fracture (series 4222, image 18; series 0007, image 23).
Scapholunate alignment is maintained.

Nondisplaced fracture of the volar lunate (series 0007, image 21).

Tiny ossific density interposed between the trapezoid and capitate
(series 4222, image 16) without definite fracture donor site.

The remaining osseous structures are intact. No dislocation. No
malalignment.

Ligaments

Suboptimally assessed by CT.

Muscles and Tendons

No muscular atrophy or fatty infiltration. Flexor and extensor
tendons appear grossly intact without tenosynovitis.

Soft tissues

Mild soft tissue swelling and subcutaneous edema at the fracture
sites.
IMPRESSION: 1. Comminuted, minimally displaced intra-articular fracture of the
distal radius.
2. Mildly comminuted nondisplaced scaphoid fractures involving the
distal pole and scaphoid waist.
3. Nondisplaced fracture of the volar lunate.
4. Tiny ossific fragment interposed between the trapezoid and
capitate which may represent a tiny avulsion fracture. No discrete
fracture donor site identified.
5. Osseous alignment is maintained without dislocation or evidence
of obvious ligamentous disruption.

## 2021-05-17 ENCOUNTER — Encounter: Payer: Self-pay | Admitting: Emergency Medicine

## 2021-05-17 ENCOUNTER — Ambulatory Visit
Admission: EM | Admit: 2021-05-17 | Discharge: 2021-05-17 | Disposition: A | Discharge status: Home / Self Care | Payer: BC Managed Care – PPO | Attending: Physician Assistant | Admitting: Physician Assistant

## 2021-05-17 ENCOUNTER — Other Ambulatory Visit: Payer: Self-pay

## 2021-05-17 DIAGNOSIS — J069 Acute upper respiratory infection, unspecified: Secondary | ICD-10-CM

## 2021-05-17 NOTE — ED Provider Notes (Signed)
[facility]    CSN: [id number] Arrival date & time: 05/17/21  1105      History   Chief Complaint Chief Complaint  Patient presents with   Generalized Body Aches    HPI Aaron Holt is a 20 y.o. male.   Patient here today for evaluation of body aches, chills, congestion and cough that started 3 days ago.  She denies any nausea, vomiting or diarrhea.  She has tried NyQuil with mild relief of symptoms.  The history is provided by the patient.   History reviewed. No pertinent past medical history.  Patient Active Problem List   Diagnosis Date Noted   Right knee injury 04/23/2013   KNEE SPRAIN, LEFT, MEDIAL COLLATERAL LIGAMENT 03/24/2010   MUSCLE SPASM, HAMSTRING MUSCLE 02/02/2009   PES PLANUS 02/02/2009    History reviewed. No pertinent surgical history.     Home Medications    Prior to Admission medications   Medication Sig Start Date End Date Taking? Authorizing Provider  chlorhexidine (PERIDEX) 0.12 % solution Use as directed 10 mLs in the mouth or throat 3 (three) times daily. Swish for 1 minutes and then spit 02/11/19   Raeford Razor, MD  clindamycin (CLEOCIN) 300 MG capsule Take 1 capsule (300 mg total) by mouth 3 (three) times daily. 02/11/19   Raeford Razor, MD  HYDROcodone-acetaminophen (NORCO/VICODIN) 5-325 MG tablet Take 1-2 tablets by mouth every 6 (six) hours as needed. 02/11/19   Raeford Razor, MD    Family History Family History  Problem Relation Age of Onset   Diabetes Father    Hyperlipidemia Father    Hypertension Father    Heart attack Neg Hx    Sudden death Neg Hx     Social History Social History   Tobacco Use   Smoking status: Never   Smokeless tobacco: Never  Substance Use Topics   Alcohol use: Never   Drug use: Never     Allergies   Patient has no known allergies.   Review of Systems Review of Systems  Constitutional:  Positive for chills and fever.  HENT:  Positive for congestion and sore throat.  Negative for ear pain.   Eyes:  Negative for discharge and redness.  Respiratory:  Positive for cough. Negative for shortness of breath.   Gastrointestinal:  Negative for abdominal pain, diarrhea, nausea and vomiting.    Physical Exam Triage Vital Signs ED Triage Vitals  Enc Vitals Group     BP 05/17/21 1227 140/85     Pulse Rate 05/17/21 1227 84     Resp --      Temp 05/17/21 1227 98.3 F (36.8 C)     Temp Source 05/17/21 1227 Oral     SpO2 05/17/21 1227 97 %     Weight 05/17/21 1228 230 lb (104.3 kg)     Height 05/17/21 1228 6\' 1"  (1.854 m)     Head Circumference --      Peak Flow --      Pain Score 05/17/21 1228 4     Pain Loc --      Pain Edu? --      Excl. in GC? --    No data found.  Updated Vital Signs BP 140/85 (BP Location: Left Arm)   Pulse 84   Temp 98.3 F (36.8 C) (Oral)   Ht 6\' 1"  (1.854 m)   Wt 230 lb (104.3 kg)   SpO2 97%   BMI 30.34 kg/m      Physical Exam  Vitals and nursing note reviewed.  Constitutional:      General: He is not in acute distress.    Appearance: Normal appearance. He is not ill-appearing.  HENT:     Head: Normocephalic and atraumatic.     Nose: Congestion present.     Mouth/Throat:     Mouth: Mucous membranes are moist.     Pharynx: Oropharynx is clear. No oropharyngeal exudate or posterior oropharyngeal erythema.  Eyes:     Conjunctiva/sclera: Conjunctivae normal.  Cardiovascular:     Rate and Rhythm: Normal rate and regular rhythm.     Heart sounds: Normal heart sounds. No murmur heard. Pulmonary:     Effort: Pulmonary effort is normal. No respiratory distress.     Breath sounds: Normal breath sounds. No wheezing, rhonchi or rales.  Skin:    General: Skin is warm and dry.  Neurological:     Mental Status: He is alert.  Psychiatric:        Mood and Affect: Mood normal.        Thought Content: Thought content normal.     UC Treatments / Results  Labs (all labs ordered are listed, but only abnormal results are  displayed) Labs Reviewed  COVID-19, FLU A+B NAA    EKG   Radiology No results found.  Procedures Procedures (including critical care time)  Medications Ordered in UC Medications - No data to display  Initial Impression / Assessment and Plan / UC Course  I have reviewed the triage vital signs and the nursing notes.  Pertinent labs & imaging results that were available during my care of the patient were reviewed by me and considered in my medical decision making (see chart for details).    Suspect likely viral etiology of symptoms.  Will order screening for COVID and flu.  Will await results for further recommendation.  Recommended symptomatic treatment, increase fluids and rest.  Encouraged follow-up with any further concerns.  Final Clinical Impressions(s) / UC Diagnoses   Final diagnoses:  Acute upper respiratory infection   Discharge Instructions   None    ED Prescriptions   None    PDMP not reviewed this encounter.   Tomi Bamberger, PA-C 05/17/21 1311

## 2021-05-17 NOTE — ED Triage Notes (Signed)
Patient c/o possible flu, started with body aches and chills on Monday.  Patient is coughing, congestion and runny nose.  Patient has taken Nyquil.  Patient is not vaccinated for COVID.

## 2021-05-18 LAB — COVID-19, FLU A+B NAA
Influenza A, NAA: NOT DETECTED
Influenza B, NAA: NOT DETECTED
SARS-CoV-2, NAA: NOT DETECTED

## 2024-01-07 ENCOUNTER — Ambulatory Visit
Admission: EM | Admit: 2024-01-07 | Discharge: 2024-01-07 | Disposition: A | Discharge status: Home / Self Care | Attending: Emergency Medicine | Admitting: Emergency Medicine

## 2024-01-07 DIAGNOSIS — R21 Rash and other nonspecific skin eruption: Secondary | ICD-10-CM

## 2024-01-07 DIAGNOSIS — H1013 Acute atopic conjunctivitis, bilateral: Secondary | ICD-10-CM

## 2024-01-07 MED ORDER — OLOPATADINE HCL 0.2 % OP SOLN: 1.0000 [drp] | Freq: Every day | OPHTHALMIC | 0 refills | Status: AC | PRN

## 2024-01-07 MED ORDER — PREDNISONE 10 MG (21) PO TBPK: ORAL_TABLET | Freq: Every day | ORAL | 0 refills | Status: AC

## 2024-01-07 NOTE — Discharge Instructions (Signed)
 Your evaluated for your rash and I do believe your symptoms are related to an allergic reaction at this time no signs of infection  Begin prednisone  every morning with food as directed to reduce inflammatory response and help to clear symptoms  You may use allergy eyedrops every morning as needed to help reduce swelling and irritation  For itching you may continue Benadryl, may also try Claritin Zyrtec, topical Benadryl calamine lotion  May apply ice or cool compresses over the affected areas  Be mindful that heat will cause further irritation to the skin if this occurs cool your skin back now  May follow-up with urgent care as needed

## 2024-01-07 NOTE — ED Triage Notes (Signed)
 Patient to Urgent Care with complaints of  a rash present to chest/ shoulders/ necks/ face/ eyes.   Symptoms started Sunday. Denies any new product usage/ foods.   Taking benadryl.

## 2024-01-07 NOTE — ED Provider Notes (Signed)
 Aaron Holt    CSN: [id number] Arrival date & time: 01/07/24  9041      History   Chief Complaint Chief Complaint  Patient presents with   Rash    HPI Aaron Holt is a 23 y.o. male.   Patient presents for evaluation of a pruritic rash present to the face the neck and the chest beginning 2 days ago.  Experiencing swelling and increased tearing to the bilateral eyes and endorses irritation.  Denies purulent drainage.  Denies changes in toiletries, diet or recent travel, no other close contacts have similar symptoms.  Has attempted use of Benadryl which has been minimally helpful.   History reviewed. No pertinent past medical history.  Patient Active Problem List   Diagnosis Date Noted   Right knee injury 04/23/2013   KNEE SPRAIN, LEFT, MEDIAL COLLATERAL LIGAMENT 03/24/2010   MUSCLE SPASM, HAMSTRING MUSCLE 02/02/2009   Pes planus 02/02/2009    History reviewed. No pertinent surgical history.     Home Medications    Prior to Admission medications   Medication Sig Start Date End Date Taking? Authorizing Provider  Olopatadine  HCl 0.2 % SOLN Apply 1 drop to eye daily as needed. 01/07/24  Yes Shaeleigh Graw R, NP  predniSONE  (STERAPRED UNI-PAK 21 TAB) 10 MG (21) TBPK tablet Take by mouth daily. Take 6 tabs by mouth daily  for 1 days, then 5 tabs for 1 days, then 4 tabs for 1 days, then 3 tabs for 1 days, 2 tabs for 1 days, then 1 tab by mouth daily for 1 days 01/07/24  Yes Davidlee Jeanbaptiste R, NP  chlorhexidine  (PERIDEX ) 0.12 % solution Use as directed 10 mLs in the mouth or throat 3 (three) times daily. Swish for 1 minutes and then spit Patient not taking: Reported on 01/07/2024 02/11/19   Loetta Senior, MD  clindamycin  (CLEOCIN ) 300 MG capsule Take 1 capsule (300 mg total) by mouth 3 (three) times daily. Patient not taking: Reported on 01/07/2024 02/11/19   Loetta Senior, MD  HYDROcodone -acetaminophen  (NORCO/VICODIN) 5-325 MG tablet Take 1-2 tablets by mouth every 6  (six) hours as needed. Patient not taking: Reported on 01/07/2024 02/11/19   Loetta Senior, MD    Family History Family History  Problem Relation Age of Onset   Diabetes Father    Hyperlipidemia Father    Hypertension Father    Heart attack Neg Hx    Sudden death Neg Hx     Social History Social History   Tobacco Use   Smoking status: Never   Smokeless tobacco: Never  Substance Use Topics   Alcohol use: Never   Drug use: Never     Allergies   Patient has no known allergies.   Review of Systems Review of Systems  Skin:  Positive for rash.     Physical Exam Triage Vital Signs ED Triage Vitals  Encounter Vitals Group     BP 01/07/24 1033 (!) 149/85     Girls Systolic BP Percentile --      Girls Diastolic BP Percentile --      Boys Systolic BP Percentile --      Boys Diastolic BP Percentile --      Pulse Rate 01/07/24 1033 90     Resp 01/07/24 1033 18     Temp 01/07/24 1033 98.3 F (36.8 C)     Temp src --      SpO2 01/07/24 1033 97 %     Weight --  Height --      Head Circumference --      Peak Flow --      Pain Score 01/07/24 1034 0     Pain Loc --      Pain Education --      Exclude from Growth Chart --    No data found.  Updated Vital Signs BP (!) 149/85   Pulse 90   Temp 98.3 F (36.8 C)   Resp 18   SpO2 97%   Visual Acuity Right Eye Distance:   Left Eye Distance:   Bilateral Distance:    Right Eye Near:   Left Eye Near:    Bilateral Near:     Physical Exam Constitutional:      Appearance: Normal appearance.  Eyes:     Comments: Mild to moderate swelling of the bilateral periorbital region, no drainage noted, vision grossly intact, extraocular movements intact  Skin:    Comments: Erythematous and flesh tone fine papular rash present over the face, neck and the upper trunk  Neurological:     Mental Status: He is alert.      UC Treatments / Results  Labs (all labs ordered are listed, but only abnormal results are  displayed) Labs Reviewed - No data to display  EKG   Radiology No results found.  Procedures Procedures (including critical care time)  Medications Ordered in UC Medications - No data to display  Initial Impression / Assessment and Plan / UC Course  I have reviewed the triage vital signs and the nursing notes.  Pertinent labs & imaging results that were available during my care of the patient were reviewed by me and considered in my medical decision making (see chart for details).  Rash, Allergic conjunctivitis of both eyes  Presentation concerning consistent with inflammatory response, no signs of infection, discussed with patient, prescribed Pataday  eyedrops and oral prednisone , declined IM injection, recommended nonpharmacological supportive care and over-the-counter medications, advised follow-up if symptoms persist, work note given Final Clinical Impressions(s) / UC Diagnoses   Final diagnoses:  Rash and nonspecific skin eruption  Allergic conjunctivitis of both eyes     Discharge Instructions      Your evaluated for your rash and I do believe your symptoms are related to an allergic reaction at this time no signs of infection  Begin prednisone  every morning with food as directed to reduce inflammatory response and help to clear symptoms  You may use allergy eyedrops every morning as needed to help reduce swelling and irritation  For itching you may continue Benadryl, may also try Claritin Zyrtec, topical Benadryl calamine lotion  May apply ice or cool compresses over the affected areas  Be mindful that heat will cause further irritation to the skin if this occurs cool your skin back now  May follow-up with urgent care as needed   ED Prescriptions     Medication Sig Dispense Auth. Provider   predniSONE  (STERAPRED UNI-PAK 21 TAB) 10 MG (21) TBPK tablet Take by mouth daily. Take 6 tabs by mouth daily  for 1 days, then 5 tabs for 1 days, then 4 tabs for 1 days,  then 3 tabs for 1 days, 2 tabs for 1 days, then 1 tab by mouth daily for 1 days 21 tablet Charlis Harner R, NP   Olopatadine  HCl 0.2 % SOLN Apply 1 drop to eye daily as needed. 2.5 mL Teresa Shelba SAUNDERS, NP      PDMP not reviewed this encounter.  Teresa Shelba SAUNDERS, NP 01/07/24 1057

## 2024-04-16 ENCOUNTER — Encounter: Payer: Self-pay | Admitting: Emergency Medicine

## 2024-04-16 ENCOUNTER — Ambulatory Visit
Admission: EM | Admit: 2024-04-16 | Discharge: 2024-04-16 | Disposition: A | Discharge status: Home / Self Care | Attending: Emergency Medicine | Admitting: Emergency Medicine

## 2024-04-16 DIAGNOSIS — J069 Acute upper respiratory infection, unspecified: Secondary | ICD-10-CM

## 2024-04-16 MED ORDER — CYCLOBENZAPRINE HCL 5 MG PO TABS: 5.0000 mg | ORAL_TABLET | Freq: Three times a day (TID) | ORAL | 0 refills | Status: AC | PRN

## 2024-04-16 NOTE — Discharge Instructions (Signed)
 Your symptoms today are most likely being caused by a virus and should steadily improve in time it can take up to 7 to 10 days before you truly start to see a turnaround however things will get better  COVID and flu testing negative  May use muscle relaxant every 8 hours as needed to further help with bodyaches    You can take Tylenol  and/or Ibuprofen  as needed for fever reduction and pain relief.   For cough: honey 1/2 to 1 teaspoon (you can dilute the honey in water or another fluid).  You can also use guaifenesin and dextromethorphan for cough. You can use a humidifier for chest congestion and cough.  If you don't have a humidifier, you can sit in the bathroom with the hot shower running.      For sore throat: try warm salt water gargles, cepacol lozenges, throat spray, warm tea or water with lemon/honey, popsicles or ice, or OTC cold relief medicine for throat discomfort.   For congestion: take a daily anti-histamine like Zyrtec, Claritin, and a oral decongestant, such as pseudoephedrine.  You can also use Flonase 1-2 sprays in each nostril daily.   It is important to stay hydrated: drink plenty of fluids (water, gatorade/powerade/pedialyte, juices, or teas) to keep your throat moisturized and help further relieve irritation/discomfort.

## 2024-04-16 NOTE — ED Provider Notes (Signed)
 [facility]    CSN: [id number] Arrival date & time: 04/16/24  1052      History   Chief Complaint Chief Complaint  Patient presents with   Sore Throat   Cough   Nasal Congestion   Chills   Generalized Body Aches    HPI Aaron Holt is a 23 y.o. male.   For evaluation of chills, body aches, nasal congestion, nonproductive cough, sinus pressure underneath the eyes into the bridge of the nose and a sore throat beginning 1 day ago.  History of asthma, did attempt use of inhaler but denies shortness of breath and wheezing.  Denies presence of fever.  Has attempted use of Mucinex severe and Tylenol  Cold and flu.  Tolerable to food and liquids but appetite is decreased.  No known sick contact prior.  History reviewed. No pertinent past medical history.  Patient Active Problem List   Diagnosis Date Noted   Right knee injury 04/23/2013   KNEE SPRAIN, LEFT, MEDIAL COLLATERAL LIGAMENT 03/24/2010   MUSCLE SPASM, HAMSTRING MUSCLE 02/02/2009   Pes planus 02/02/2009    History reviewed. No pertinent surgical history.     Home Medications    Prior to Admission medications   Medication Sig Start Date End Date Taking? Authorizing Provider  cyclobenzaprine  (FLEXERIL ) 5 MG tablet Take 1 tablet (5 mg total) by mouth 3 (three) times daily as needed for muscle spasms. 04/16/24  Yes Nariya Neumeyer R, NP  chlorhexidine  (PERIDEX ) 0.12 % solution Use as directed 10 mLs in the mouth or throat 3 (three) times daily. Swish for 1 minutes and then spit Patient not taking: Reported on 01/07/2024 02/11/19   Loetta Senior, MD  clindamycin  (CLEOCIN ) 300 MG capsule Take 1 capsule (300 mg total) by mouth 3 (three) times daily. Patient not taking: Reported on 01/07/2024 02/11/19   Loetta Senior, MD  HYDROcodone -acetaminophen  (NORCO/VICODIN) 5-325 MG tablet Take 1-2 tablets by mouth every 6 (six) hours as needed. Patient not taking: Reported on 01/07/2024 02/11/19   Loetta Senior, MD   Olopatadine  HCl 0.2 % SOLN Apply 1 drop to eye daily as needed. 01/07/24   Aine Strycharz, Shelba SAUNDERS, NP  predniSONE  (STERAPRED UNI-PAK 21 TAB) 10 MG (21) TBPK tablet Take by mouth daily. Take 6 tabs by mouth daily  for 1 days, then 5 tabs for 1 days, then 4 tabs for 1 days, then 3 tabs for 1 days, 2 tabs for 1 days, then 1 tab by mouth daily for 1 days 01/07/24   Teresa Shelba SAUNDERS, NP    Family History Family History  Problem Relation Age of Onset   Diabetes Father    Hyperlipidemia Father    Hypertension Father    Heart attack Neg Hx    Sudden death Neg Hx     Social History Social History   Tobacco Use   Smoking status: Never   Smokeless tobacco: Never  Vaping Use   Vaping status: Never Used  Substance Use Topics   Alcohol use: Never   Drug use: Never     Allergies   Patient has no known allergies.   Review of Systems Review of Systems   Physical Exam Triage Vital Signs ED Triage Vitals  Encounter Vitals Group     BP 04/16/24 1134 139/85     Girls Systolic BP Percentile --      Girls Diastolic BP Percentile --      Boys Systolic BP Percentile --      Boys Diastolic BP Percentile --  Pulse Rate 04/16/24 1134 89     Resp 04/16/24 1134 20     Temp 04/16/24 1134 99.1 F (37.3 C)     Temp Source 04/16/24 1134 Oral     SpO2 04/16/24 1134 98 %     Weight --      Height --      Head Circumference --      Peak Flow --      Pain Score 04/16/24 1130 3     Pain Loc --      Pain Education --      Exclude from Growth Chart --    No data found.  Updated Vital Signs BP 139/85 (BP Location: Left Arm)   Pulse 89   Temp 99.1 F (37.3 C) (Oral)   Resp 20   SpO2 98%   Visual Acuity Right Eye Distance:   Left Eye Distance:   Bilateral Distance:    Right Eye Near:   Left Eye Near:    Bilateral Near:     Physical Exam Constitutional:      Appearance: Normal appearance.  HENT:     Head: Normocephalic.     Right Ear: Tympanic membrane, ear canal and external  ear normal.     Left Ear: Tympanic membrane, ear canal and external ear normal.     Nose: Congestion present.     Mouth/Throat:     Pharynx: Posterior oropharyngeal erythema present. No oropharyngeal exudate.  Eyes:     Extraocular Movements: Extraocular movements intact.  Cardiovascular:     Rate and Rhythm: Normal rate and regular rhythm.     Pulses: Normal pulses.     Heart sounds: Normal heart sounds.  Pulmonary:     Effort: Pulmonary effort is normal.     Breath sounds: Normal breath sounds.  Musculoskeletal:     Cervical back: Normal range of motion and neck supple.  Neurological:     Mental Status: He is alert and oriented to person, place, and time. Mental status is at baseline.      UC Treatments / Results  Labs (all labs ordered are listed, but only abnormal results are displayed) Labs Reviewed - No data to display  EKG   Radiology No results found.  Procedures Procedures (including critical care time)  Medications Ordered in UC Medications - No data to display  Initial Impression / Assessment and Plan / UC Course  I have reviewed the triage vital signs and the nursing notes.  Pertinent labs & imaging results that were available during my care of the patient were reviewed by me and considered in my medical decision making (see chart for details).  Viral URI with cough  Patient is in no signs of distress nor toxic appearing.  Vital signs are stable.  Low suspicion for pneumonia, pneumothorax or bronchitis and therefore will defer imaging.  COVID and flu testing negative, discussed findings with patient.  Prescribed Xarelto as body aches most worrisome symptom.May use additional over-the-counter medications as needed for supportive care.  May follow-up with urgent care as needed if symptoms persist or worsen.  Note given.   Final Clinical Impressions(s) / UC Diagnoses   Final diagnoses:  Viral URI with cough     Discharge Instructions      Your  symptoms today are most likely being caused by a virus and should steadily improve in time it can take up to 7 to 10 days before you truly start to see a turnaround however things will  get better  COVID and flu testing negative  May use muscle relaxant every 8 hours as needed to further help with bodyaches    You can take Tylenol  and/or Ibuprofen  as needed for fever reduction and pain relief.   For cough: honey 1/2 to 1 teaspoon (you can dilute the honey in water or another fluid).  You can also use guaifenesin and dextromethorphan for cough. You can use a humidifier for chest congestion and cough.  If you don't have a humidifier, you can sit in the bathroom with the hot shower running.      For sore throat: try warm salt water gargles, cepacol lozenges, throat spray, warm tea or water with lemon/honey, popsicles or ice, or OTC cold relief medicine for throat discomfort.   For congestion: take a daily anti-histamine like Zyrtec, Claritin, and a oral decongestant, such as pseudoephedrine.  You can also use Flonase 1-2 sprays in each nostril daily.   It is important to stay hydrated: drink plenty of fluids (water, gatorade/powerade/pedialyte, juices, or teas) to keep your throat moisturized and help further relieve irritation/discomfort.    ED Prescriptions     Medication Sig Dispense Auth. Provider   cyclobenzaprine  (FLEXERIL ) 5 MG tablet Take 1 tablet (5 mg total) by mouth 3 (three) times daily as needed for muscle spasms. 20 tablet Luwanda Starr R, NP      PDMP not reviewed this encounter.   Teresa Shelba SAUNDERS, NP 04/16/24 1154

## 2024-04-16 NOTE — ED Triage Notes (Signed)
 Patient reports dry cough, headache, body aches, chills, sore throat and runny nose x 2 days. Patient has been taking Tylenol /Motrin , cold and flu and mucinex with mild relief. Rates headache 3/10 and sore throat pain 5/10.
